# Patient Record
Sex: Female | Born: 2005 | Race: Black or African American | Hispanic: No | Marital: Single | Smoking: Never smoker
Health system: Southern US, Community
[De-identification: ages and names within clinical notes are randomized; demographics above are authoritative.]

## PROBLEM LIST (undated history)

## (undated) ENCOUNTER — Ambulatory Visit: Payer: Medicaid Other | Source: Home / Self Care

---
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
## 2024-05-18 ENCOUNTER — Ambulatory Visit
Admission: EM | Admit: 2024-05-18 | Discharge: 2024-05-18 | Disposition: A | Discharge status: Home / Self Care | Source: Home / Self Care

## 2024-05-18 DIAGNOSIS — R350 Frequency of micturition: Secondary | ICD-10-CM | POA: Diagnosis not present

## 2024-05-18 DIAGNOSIS — N760 Acute vaginitis: Secondary | ICD-10-CM | POA: Diagnosis not present

## 2024-05-18 DIAGNOSIS — Z113 Encounter for screening for infections with a predominantly sexual mode of transmission: Secondary | ICD-10-CM | POA: Diagnosis not present

## 2024-05-18 LAB — POCT URINE DIPSTICK
Bilirubin, UA: NEGATIVE
Blood, UA: NEGATIVE
Glucose, UA: NEGATIVE mg/dL
Leukocytes, UA: NEGATIVE
Nitrite, UA: NEGATIVE
POC PROTEIN,UA: NEGATIVE
Spec Grav, UA: 1.02
Urobilinogen, UA: 0.2 U/dL
pH, UA: 6.5

## 2024-05-18 LAB — POCT URINE PREGNANCY: Preg Test, Ur: NEGATIVE

## 2024-05-18 NOTE — ED Provider Notes (Signed)
 [facility]  Note:  This document was prepared using Conservation officer, historic buildings and may include unintentional dictation errors.  MRN: [id number] DOB: 2006/04/09  Subjective:   Theresa Sparks is a 19 y.o. female presenting for 1 week history of persisting urinary frequency, urinary urgency, vaginal discharge.  Had nausea and vomiting over the past day.  Would like complete STI testing.  Tries to hydrate well.  Also drinks fruit juice daily.  No dysuria, hematuria, vaginal bleeding, fever, abdominal or pelvic pain.  Current Outpatient Medications  Medication Instructions   cetirizine  (ZYRTEC  ALLERGY) 10 mg, Oral, Daily   ibuprofen  (ADVIL ) 600 mg, Oral, Every 6 hours PRN   ondansetron  (ZOFRAN ) 4 mg, Oral, Every 8 hours PRN   oseltamivir  (TAMIFLU ) 75 mg, Oral, 2 times daily   promethazine -dextromethorphan (PROMETHAZINE -DM) 6.25-15 MG/5ML syrup 5 mLs, Oral, 3 times daily PRN   pseudoephedrine  (SUDAFED) 30 mg, Oral, Every 8 hours PRN    Allergies[1]  History reviewed. No pertinent past medical history.   History reviewed. No pertinent surgical history.  Family History  Family history unknown: Yes    Social History   Occupational History   Not on file  Tobacco Use   Smoking status: Never   Smokeless tobacco: Never  Vaping Use   Vaping status: Never Used  Substance and Sexual Activity   Alcohol use: Not Currently   Drug use: Not Currently    Types: Marijuana   Sexual activity: Yes    Birth control/protection: None     ROS   Objective:   Vitals: BP (!) 100/59 (BP Location: Right Arm)   Pulse 87   Temp 98 F (36.7 C) (Oral)   Resp 19   Ht 4' 11 (1.499 m)   Wt 100 lb (45.4 kg)   LMP 05/06/2024   SpO2 98%   BMI 20.20 kg/m   Physical Exam Constitutional:      General: She is not in acute distress.    Appearance: Normal appearance. She is well-developed. She is not ill-appearing, toxic-appearing or diaphoretic.  HENT:     Head:  Normocephalic and atraumatic.     Nose: Nose normal.     Mouth/Throat:     Mouth: Mucous membranes are moist.  Eyes:     General: No scleral icterus.       Right eye: No discharge.        Left eye: No discharge.     Extraocular Movements: Extraocular movements intact.     Conjunctiva/sclera: Conjunctivae normal.  Cardiovascular:     Rate and Rhythm: Normal rate.  Pulmonary:     Effort: Pulmonary effort is normal.  Abdominal:     General: Bowel sounds are normal. There is no distension.     Palpations: Abdomen is soft. There is no mass.     Tenderness: There is no abdominal tenderness. There is no right CVA tenderness, left CVA tenderness, guarding or rebound.  Skin:    General: Skin is warm and dry.  Neurological:     General: No focal deficit present.     Mental Status: She is alert and oriented to person, place, and time.  Psychiatric:        Mood and Affect: Mood normal.        Behavior: Behavior normal.        Thought Content: Thought content normal.        Judgment: Judgment normal.     Results for orders placed or performed  during the hospital encounter of 05/18/24 (from the past 24 hours)  POCT URINE DIPSTICK     Status: Abnormal   Collection Time: 05/18/24  1:28 PM  Result Value Ref Range   Color, UA yellow yellow   Clarity, UA clear clear   Glucose, UA negative negative mg/dL   Bilirubin, UA negative negative   Ketones, POC UA trace (5) (A) negative mg/dL   Spec Grav, UA 8.979 8.989 - 1.025   Blood, UA negative negative   pH, UA 6.5 5.0 - 8.0   POC PROTEIN,UA negative negative, trace   Urobilinogen, UA 0.2 0.2 or 1.0 E.U./dL   Nitrite, UA Negative Negative   Leukocytes, UA Negative Negative  POCT urine pregnancy     Status: None   Collection Time: 05/18/24  1:28 PM  Result Value Ref Range   Preg Test, Ur Negative Negative    Assessment and Plan :   PDMP not reviewed this encounter.  1. Acute vaginitis   2. Urinary frequency   3. Screening  examination for STI      Recommended more consistent hydration, avoidance of urinary irritants, fruit juices.  Urine culture and complete STI check pending.  Counseled patient on potential for adverse effects with medications prescribed/recommended today, ER and return-to-clinic precautions discussed, patient verbalized understanding.     [1] No Known Allergies    Christopher Savannah, PA-C 05/18/24 1343  "

## 2024-05-18 NOTE — Discharge Instructions (Addendum)
 Make sure you hydrate very well with plain water and a quantity of 64-80 ounces of water a day.  Please limit drinks that are considered urinary irritants such as fruit juices, soda, sweet tea, coffee, artifical sweetened drinks, energy drinks, alcohol.  These can worsen your urinary and genital symptoms but also be the source of them.  I will let you know about your urine culture, STI and vaginal swab results through MyChart to see if we need to prescribe or change your antibiotics based off of those results.

## 2024-05-18 NOTE — ED Triage Notes (Signed)
 Pt states that she has some urinary frequency, vaginal discharge and nausea.  Nausea and vomiting. X1 day Other symptoms x1 week

## 2024-05-19 ENCOUNTER — Ambulatory Visit (HOSPITAL_COMMUNITY): Payer: Self-pay

## 2024-05-19 LAB — CERVICOVAGINAL ANCILLARY ONLY
Bacterial Vaginitis (gardnerella): POSITIVE — AB
Candida Glabrata: NEGATIVE
Candida Vaginitis: NEGATIVE
Chlamydia: NEGATIVE
Comment: NEGATIVE
Comment: NEGATIVE
Comment: NEGATIVE
Comment: NEGATIVE
Comment: NEGATIVE
Comment: NORMAL
Neisseria Gonorrhea: NEGATIVE
Trichomonas: NEGATIVE

## 2024-05-19 LAB — URINE CULTURE: Culture: <10000 — AB

## 2024-05-19 LAB — SYPHILIS: RPR W/REFLEX TO RPR TITER AND TREPONEMAL ANTIBODIES, TRADITIONAL SCREENING AND DIAGNOSIS ALGORITHM: RPR Ser Ql: NONREACTIVE

## 2024-05-19 LAB — HIV ANTIBODY (ROUTINE TESTING W REFLEX): HIV Screen 4th Generation wRfx: NONREACTIVE

## 2024-05-19 MED ORDER — METRONIDAZOLE 0.75 % VA GEL: 1.0000 | Freq: Every day | VAGINAL | 0 refills | Status: AC

## 2024-05-19 MED ORDER — METRONIDAZOLE 500 MG PO TABS: 500.0000 mg | ORAL_TABLET | Freq: Two times a day (BID) | ORAL | 0 refills | Status: AC
